# Patient Record
Sex: Male | Born: 1980 | Race: Asian | Hispanic: No | Marital: Married | Smoking: Never smoker
Health system: Southern US, Community
[De-identification: ages and names within clinical notes are randomized; demographics above are authoritative.]

---

## 2018-08-19 ENCOUNTER — Ambulatory Visit: Payer: Self-pay | Admitting: Family Medicine

## 2018-09-24 ENCOUNTER — Ambulatory Visit: Payer: Self-pay | Admitting: Family Medicine

## 2018-09-25 ENCOUNTER — Ambulatory Visit (INDEPENDENT_AMBULATORY_CARE_PROVIDER_SITE_OTHER)
Admission: RE | Admit: 2018-09-25 | Discharge: 2018-09-25 | Disposition: A | Discharge status: Home / Self Care | Payer: Self-pay | Source: Ambulatory Visit | Attending: Family Medicine | Admitting: Family Medicine

## 2018-09-25 ENCOUNTER — Ambulatory Visit: Payer: Self-pay

## 2018-09-25 ENCOUNTER — Encounter (INDEPENDENT_AMBULATORY_CARE_PROVIDER_SITE_OTHER): Payer: Self-pay

## 2018-09-25 ENCOUNTER — Encounter: Payer: Self-pay | Admitting: Family Medicine

## 2018-09-25 ENCOUNTER — Ambulatory Visit (INDEPENDENT_AMBULATORY_CARE_PROVIDER_SITE_OTHER): Payer: Self-pay | Admitting: Family Medicine

## 2018-09-25 VITALS — BP 130/80 | HR 95 | Ht 68.0 in | Wt 171.0 lb

## 2018-09-25 DIAGNOSIS — M999 Biomechanical lesion, unspecified: Secondary | ICD-10-CM

## 2018-09-25 DIAGNOSIS — M25512 Pain in left shoulder: Principal | ICD-10-CM

## 2018-09-25 DIAGNOSIS — G8929 Other chronic pain: Secondary | ICD-10-CM

## 2018-09-25 MED ORDER — DICLOFENAC SODIUM 2 % TD SOLN: 2.0000 g | Freq: Two times a day (BID) | TRANSDERMAL | 3 refills | Status: AC

## 2018-09-25 MED ORDER — GABAPENTIN 100 MG PO CAPS: 200.0000 mg | ORAL_CAPSULE | Freq: Every day | ORAL | 3 refills | Status: AC

## 2018-09-25 NOTE — Patient Instructions (Signed)
Good to see you  Ice 20 minutes 2 times daily. Usually after activity and before bed. pennsaid pinkie amount topically 2 times daily as needed.  Gabapentin 200mg  at night Over the counter get  Vitamin D 2000 IU daily  Turmeric 500mg  daily  CoQ10 200mg  daily  See me again in 4 weeks and I hope we are making progress

## 2018-09-25 NOTE — Progress Notes (Signed)
[facility] [address] [place], [place] [address] Phone: [phone] Subjective:   I Ronelle Nigh am serving as a Neurosurgeon for Dr. Antoine Primas.   CC: Left shoulder pain  WGY:[id number]  Jeffrey Juarez is a 38 y.o. male coming in with complaint of left shoulder pain. Left sided head pain. Slight headache all the time even with sufficient sleep. Shoulder clicks and pops. Sometimes numbness and tingling on the pinky side of the left hand.  Patient states that the radiation of pain is intermittent.  Patient states that it seems to be constant.  Describes the pain as a dull, throbbing aching sensation in the left shoulder and neck.  Does not stop him from working out though.  Seems to be associated with headaches. Patient states for his headaches he did see a neurologist.  Patient states he had an MRI that was unremarkable.      Social History   Socioeconomic History  . Marital status: Married    Spouse name: Not on file  . Number of children: Not on file  . Years of education: Not on file  . Highest education level: Not on file  Occupational History  . Not on file  Social Needs  . Financial resource strain: Not on file  . Food insecurity:    Worry: Not on file    Inability: Not on file  . Transportation needs:    Medical: Not on file    Non-medical: Not on file  Tobacco Use  . Smoking status: Never Smoker  . Smokeless tobacco: Never Used  Substance and Sexual Activity  . Alcohol use: Not on file  . Drug use: Not on file  . Sexual activity: Not on file  Lifestyle  . Physical activity:    Days per week: Not on file    Minutes per session: Not on file  . Stress: Not on file  Relationships  . Social connections:    Talks on phone: Not on file    Gets together: Not on file    Attends religious service: Not on file    Active member of club or organization: Not on file    Attends meetings of clubs or organizations: Not on file   Relationship status: Not on file  Other Topics Concern  . Not on file  Social History Narrative  . Not on file   Not on File No family history on file.  No family history of autoimmune       Current Outpatient Medications (Other):  Marland Kitchen  Diclofenac Sodium (PENNSAID) 2 % SOLN, Place 2 g onto the skin 2 (two) times daily. Marland Kitchen  gabapentin (NEURONTIN) 100 MG capsule, Take 2 capsules (200 mg total) by mouth at bedtime.    Past medical history, social, surgical and family history all reviewed in electronic medical record.  No pertanent information unless stated regarding to the chief complaint.   Review of Systems:  No headache, visual changes, nausea, vomiting, diarrhea, constipation, dizziness, abdominal pain, skin rash, fevers, chills, night sweats, weight loss, swollen lymph nodes, body aches, joint swelling, muscle aches, chest pain, shortness of breath, mood changes.   Objective  Blood pressure 130/80, pulse 95, height 5\' 8"  (1.727 m), weight 171 lb (77.6 kg), SpO2 98 %.    General: No apparent distress alert and oriented x3 mood and affect normal, dressed appropriately.  HEENT: Pupils equal, extraocular movements intact  Respiratory: Patient's speak in full sentences and does not appear short of  breath  Cardiovascular: No lower extremity edema, non tender, no erythema  Skin: Warm dry intact with no signs of infection or rash on extremities or on axial skeleton.  Abdomen: Soft nontender  Neuro: Cranial nerves II through XII are intact, neurovascularly intact in all extremities with 2+ DTRs and 2+ pulses.  Lymph: No lymphadenopathy of posterior or anterior cervical chain or axillae bilaterally.  Gait normal with good balance and coordination.  MSK:  Non tender with full range of motion and good stability and symmetric strength and tone of  elbows, wrist, hip, knee and ankles bilaterally.  Shoulder: left Inspection reveals no abnormalities, atrophy or asymmetry. Palpation is normal  with no tenderness over AC joint or bicipital groove. ROM is full in all planes passively. Rotator cuff strength normal throughout. signs of impingement with positive Neer and Hawkin's tests, but negative empty can sign. Speeds and Yergason's tests normal. No labral pathology noted with negative Obrien's, negative clunk and good stability. Normal scapular function observed. No painful arc and no drop arm sign. No apprehension sign  MSK US performed of: left This study was ordered, performed, and interpreted by Terrilee Files D.O.  Shoulder:   Supraspinatus:  Appears normal on long and transverse views, Bursal bulge seen with shoulder abduction on impingement view. Infraspinatus:  Appears normal on long and transverse views. Significant increase in Doppler flow Subscapularis:  Appears normal on long and transverse views. Positive bursa AC joint:  Capsule undistended, no geyser sign. Glenohumeral Joint:  Appears normal without effusion. Glenoid Labrum:  Intact without visualized tears.  Some small calcium deposits in the posterior labrum Biceps Tendon:  Appears normal on long and transverse views, no fraying of tendon, tendon located in intertubercular groove, no subluxation with shoulder internal or external rotation.  Impression: Subacromial bursitis  Osteopathic findings  T3 extended rotated and side bent left inhaled third rib T7 extended rotated and side bent right       Impression and Recommendations:     This case required medical decision making of moderate complexity. The above documentation has been reviewed and is accurate and complete Judi Saa, DO       Note: This dictation was prepared with Dragon dictation along with smaller phrase technology. Any transcriptional errors that result from this process are unintentional.

## 2018-09-26 DIAGNOSIS — M25512 Pain in left shoulder: Secondary | ICD-10-CM | POA: Insufficient documentation

## 2018-09-26 DIAGNOSIS — M999 Biomechanical lesion, unspecified: Secondary | ICD-10-CM | POA: Insufficient documentation

## 2018-09-26 NOTE — Assessment & Plan Note (Signed)
Left shoulder pain.  Patient had manipulation, ultrasound showed some mild bursitis but likely not contributing to most of the pain.  X-rays of the neck as well as the left shoulder ordered as well today.  Discussed icing regimen and home exercises.  Discussed which activities to do which wants to avoid.  Patient will come back in 4 to 6 weeks if worsening pain will consider the potential for injection in the shoulder for diagnostic and hopefully therapeutic purposes.

## 2018-09-26 NOTE — Assessment & Plan Note (Signed)
Decision today to treat with OMT was based on Physical Exam  After verbal consent patient was treated with HVLA, ME, FPR techniques in thoracic areas  Patient tolerated the procedure well with improvement in symptoms  Patient given exercises, stretches and lifestyle modifications  See medications in patient instructions if given  Patient will follow up in 4 weeks  

## 2018-10-23 ENCOUNTER — Ambulatory Visit: Payer: Self-pay | Admitting: Family Medicine

## 2018-10-27 ENCOUNTER — Ambulatory Visit: Payer: Self-pay | Admitting: Family Medicine

## 2020-08-01 IMAGING — DX DG SHOULDER 2+V*L*
3 series · 3 of 3 positions shown · non-contrast
Comparison: None.

CLINICAL DATA: Left-sided neck and left shoulder pain for
approximately 1 year. No injury.

EXAM:
LEFT SHOULDER - 2+ VIEW

[grashey]
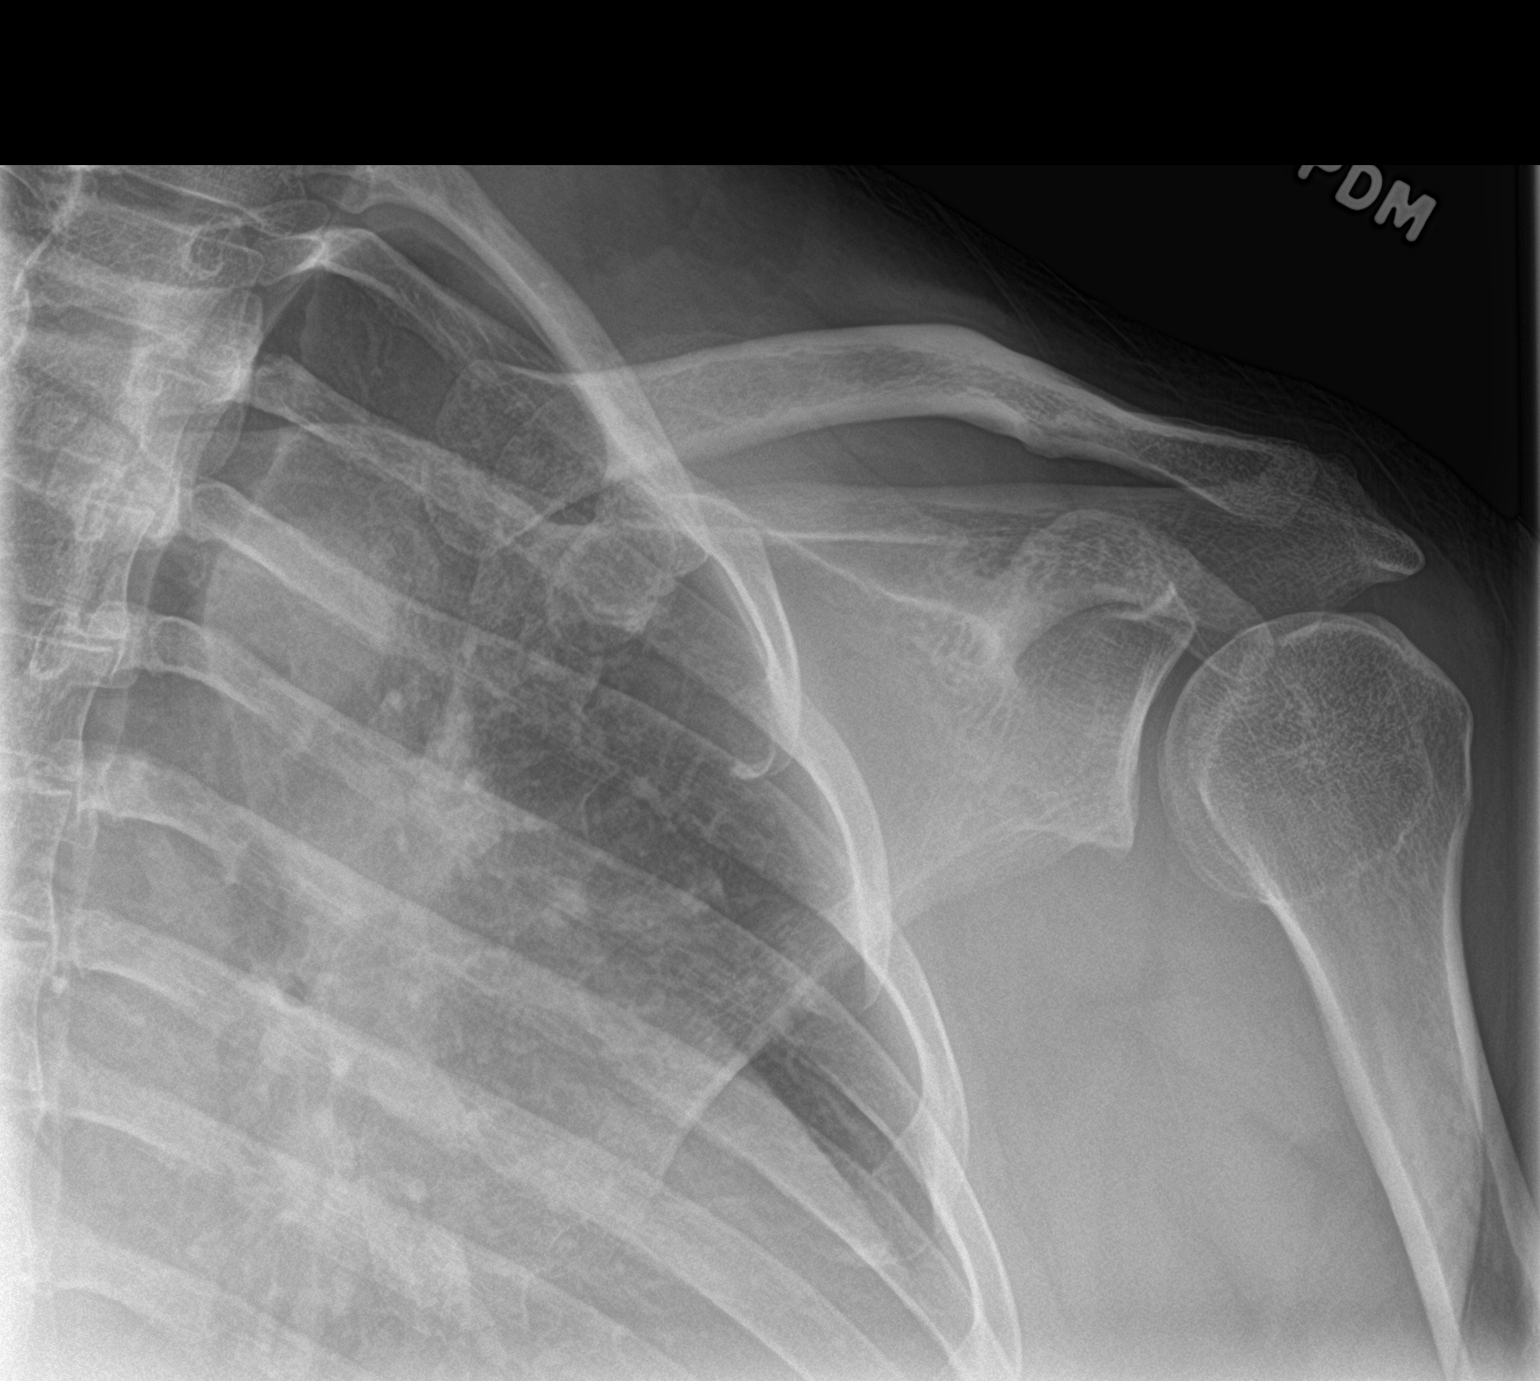

[y view]
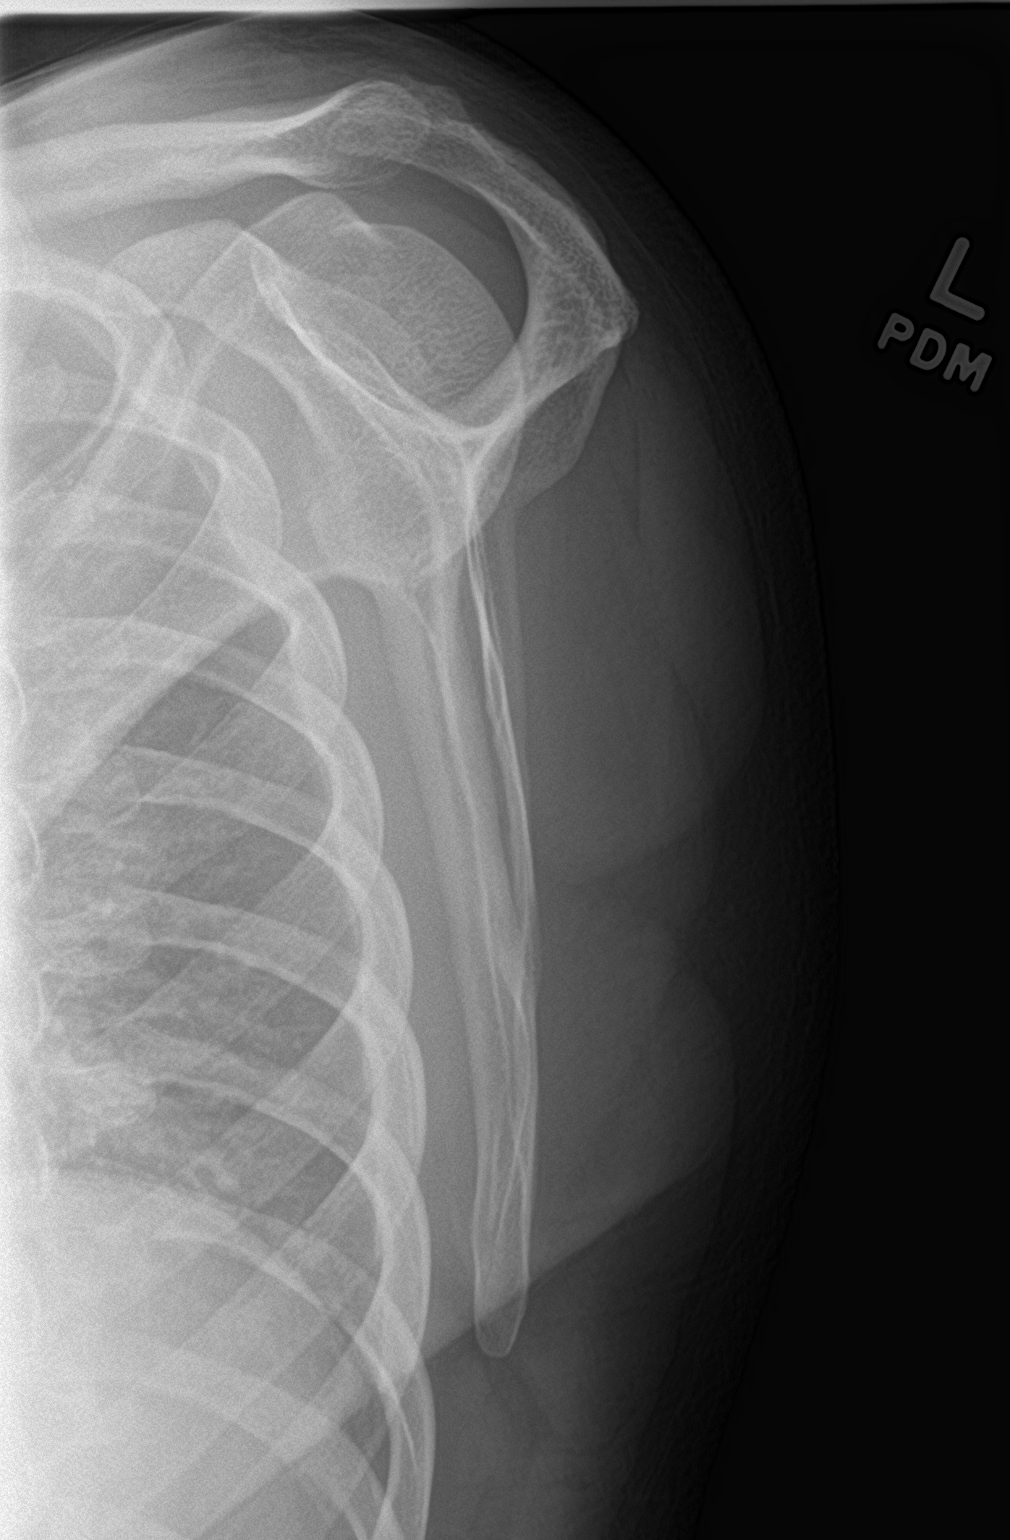

[shoulder axial]
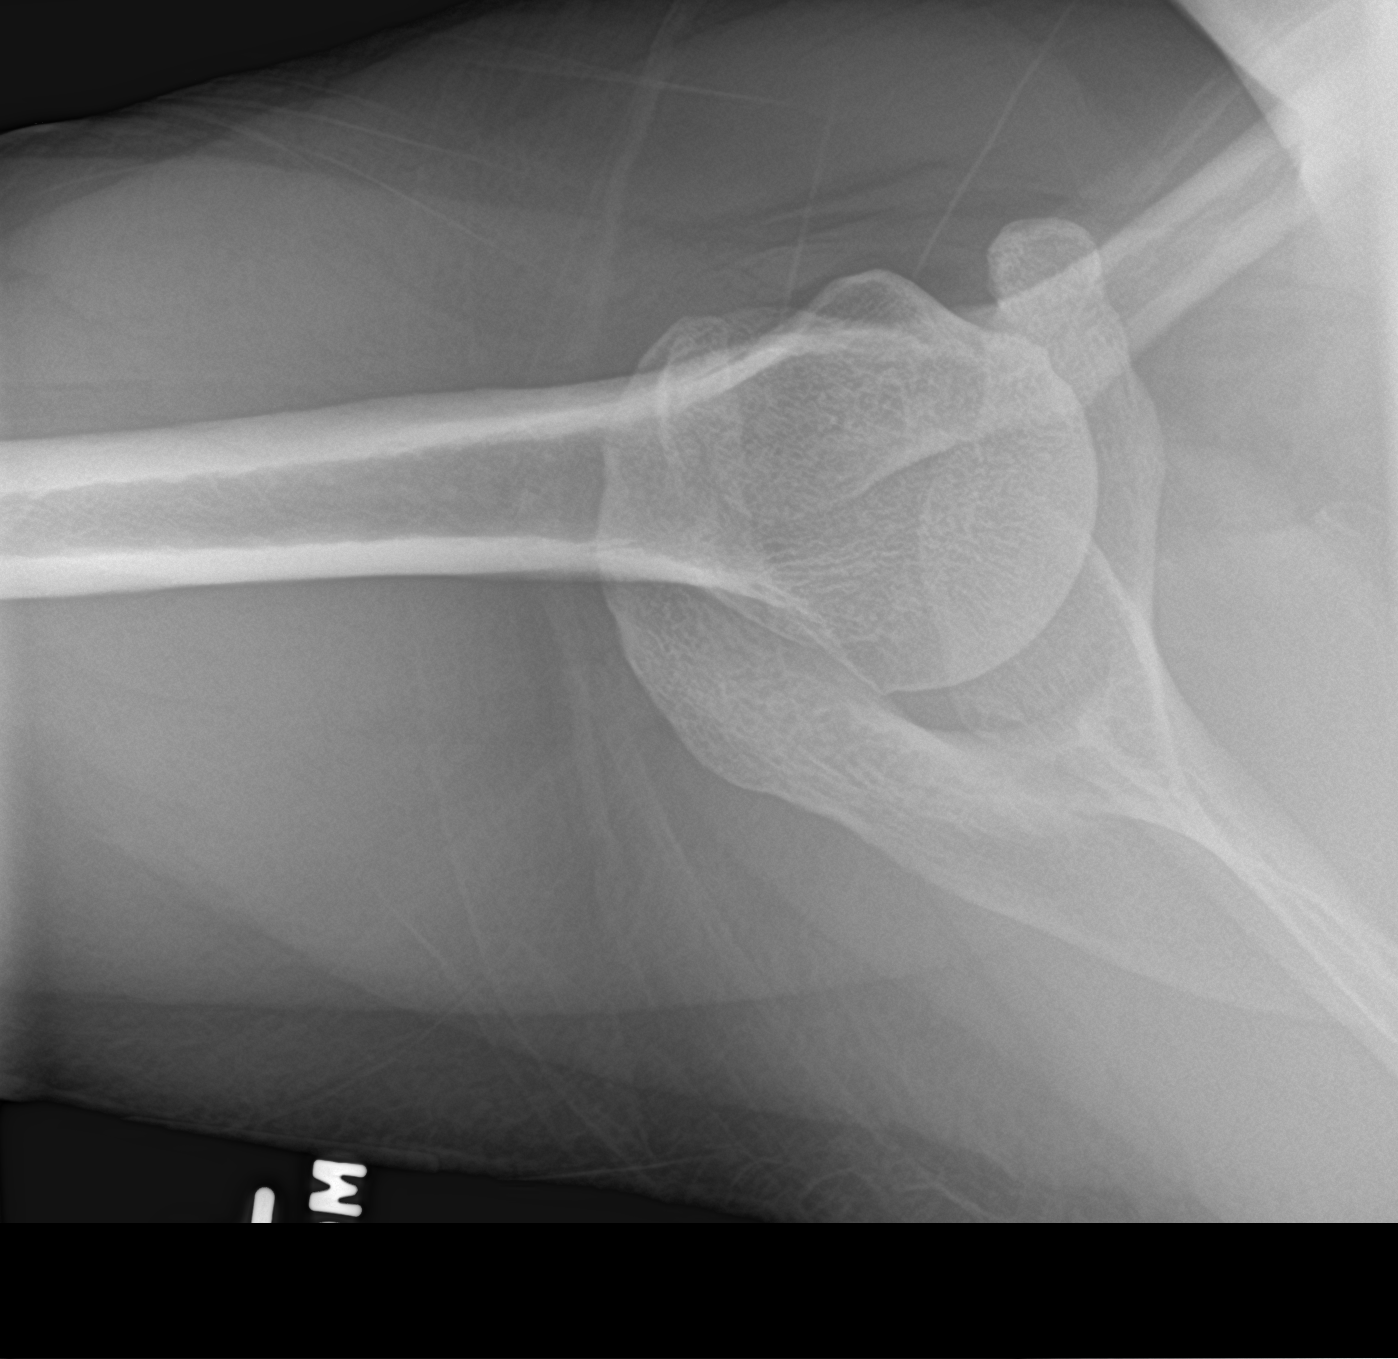

[3 of 3 positions shown; findings below may reference images not displayed]

FINDINGS: There is no evidence of fracture or dislocation. There is no
evidence of arthropathy or other focal bone abnormality. Soft
tissues are unremarkable.
IMPRESSION: Negative.
